# Patient Record
Sex: Female | Born: 1950 | Race: White | Hispanic: No | Marital: Married
Health system: Southern US, Community
[De-identification: ages and names within clinical notes are randomized; demographics above are authoritative.]

## PROBLEM LIST (undated history)

## (undated) DIAGNOSIS — G300 Alzheimer's disease with early onset: Secondary | ICD-10-CM

## (undated) DIAGNOSIS — F0280 Dementia in other diseases classified elsewhere without behavioral disturbance: Secondary | ICD-10-CM

## (undated) DIAGNOSIS — R627 Adult failure to thrive: Secondary | ICD-10-CM

---

## 2017-02-16 ENCOUNTER — Emergency Department (HOSPITAL_COMMUNITY): Payer: Medicare Other

## 2017-02-16 ENCOUNTER — Inpatient Hospital Stay (HOSPITAL_COMMUNITY)
Admission: EM | Admit: 2017-02-16 | Discharge: 2017-02-19 | DRG: 871 | Disposition: A | Discharge status: Hospice | Payer: Medicare Other | Attending: Family Medicine | Admitting: Family Medicine

## 2017-02-16 ENCOUNTER — Encounter (HOSPITAL_COMMUNITY): Payer: Self-pay | Admitting: Emergency Medicine

## 2017-02-16 DIAGNOSIS — F02818 Dementia in other diseases classified elsewhere, unspecified severity, with other behavioral disturbance: Secondary | ICD-10-CM | POA: Diagnosis present

## 2017-02-16 DIAGNOSIS — R627 Adult failure to thrive: Secondary | ICD-10-CM | POA: Diagnosis present

## 2017-02-16 DIAGNOSIS — Z66 Do not resuscitate: Secondary | ICD-10-CM | POA: Diagnosis present

## 2017-02-16 DIAGNOSIS — R0602 Shortness of breath: Secondary | ICD-10-CM

## 2017-02-16 DIAGNOSIS — R0603 Acute respiratory distress: Secondary | ICD-10-CM | POA: Diagnosis present

## 2017-02-16 DIAGNOSIS — J9601 Acute respiratory failure with hypoxia: Secondary | ICD-10-CM | POA: Diagnosis present

## 2017-02-16 DIAGNOSIS — G40909 Epilepsy, unspecified, not intractable, without status epilepticus: Secondary | ICD-10-CM

## 2017-02-16 DIAGNOSIS — F0281 Dementia in other diseases classified elsewhere with behavioral disturbance: Secondary | ICD-10-CM | POA: Diagnosis present

## 2017-02-16 DIAGNOSIS — A419 Sepsis, unspecified organism: Principal | ICD-10-CM

## 2017-02-16 DIAGNOSIS — J189 Pneumonia, unspecified organism: Secondary | ICD-10-CM | POA: Diagnosis present

## 2017-02-16 DIAGNOSIS — G300 Alzheimer's disease with early onset: Secondary | ICD-10-CM | POA: Diagnosis present

## 2017-02-16 DIAGNOSIS — J181 Lobar pneumonia, unspecified organism: Secondary | ICD-10-CM | POA: Diagnosis present

## 2017-02-16 DIAGNOSIS — Z9981 Dependence on supplemental oxygen: Secondary | ICD-10-CM

## 2017-02-16 DIAGNOSIS — Z515 Encounter for palliative care: Secondary | ICD-10-CM | POA: Diagnosis not present

## 2017-02-16 DIAGNOSIS — R634 Abnormal weight loss: Secondary | ICD-10-CM | POA: Diagnosis present

## 2017-02-16 HISTORY — DX: Adult failure to thrive: R62.7

## 2017-02-16 HISTORY — DX: Alzheimer's disease with early onset: G30.0

## 2017-02-16 HISTORY — DX: Dementia in other diseases classified elsewhere, unspecified severity, without behavioral disturbance, psychotic disturbance, mood disturbance, and anxiety: F02.80

## 2017-02-16 LAB — I-STAT TROPONIN, ED: Troponin i, poc: 0 ng/mL (ref 0.00–0.08)

## 2017-02-16 LAB — COMPREHENSIVE METABOLIC PANEL
ALK PHOS: 78 U/L (ref 38–126)
ALT: 14 U/L (ref 14–54)
AST: 34 U/L (ref 15–41)
Albumin: 2.9 g/dL — ABNORMAL LOW (ref 3.5–5.0)
Anion gap: 7 (ref 5–15)
BILIRUBIN TOTAL: 0.7 mg/dL (ref 0.3–1.2)
BUN: 26 mg/dL — AB (ref 6–20)
CHLORIDE: 111 mmol/L (ref 101–111)
CO2: 29 mmol/L (ref 22–32)
CREATININE: 0.79 mg/dL (ref 0.44–1.00)
Calcium: 9 mg/dL (ref 8.9–10.3)
GFR calc Af Amer: >60 mL/min (ref >60)
Glucose, Bld: 171 mg/dL — ABNORMAL HIGH (ref 65–99)
Potassium: 3.1 mmol/L — ABNORMAL LOW (ref 3.5–5.1)
Sodium: 147 mmol/L — ABNORMAL HIGH (ref 135–145)
Total Protein: 6.3 g/dL — ABNORMAL LOW (ref 6.5–8.1)

## 2017-02-16 LAB — CBC
HEMATOCRIT: 46 % (ref 36.0–46.0)
HEMOGLOBIN: 14.3 g/dL (ref 12.0–15.0)
MCH: 33.6 pg (ref 26.0–34.0)
MCHC: 31.1 g/dL (ref 30.0–36.0)
MCV: 108 fL — AB (ref 78.0–100.0)
Platelets: 227 10*3/uL (ref 150–400)
RBC: 4.26 MIL/uL (ref 3.87–5.11)
RDW: 13.1 % (ref 11.5–15.5)
WBC: 15.5 10*3/uL — AB (ref 4.0–10.5)

## 2017-02-16 LAB — I-STAT VENOUS BLOOD GAS, ED
ACID-BASE EXCESS: 4 mmol/L — AB (ref 0.0–2.0)
BICARBONATE: 32.3 mmol/L — AB (ref 20.0–28.0)
O2 Saturation: 66 %
PH VEN: 7.319 (ref 7.250–7.430)
PO2 VEN: 39 mmHg (ref 32.0–45.0)
TCO2: 34 mmol/L — ABNORMAL HIGH (ref 22–32)
pCO2, Ven: 62.9 mmHg — ABNORMAL HIGH (ref 44.0–60.0)

## 2017-02-16 LAB — I-STAT CG4 LACTIC ACID, ED
LACTIC ACID, VENOUS: 3.05 mmol/L — AB (ref 0.5–1.9)
Lactic Acid, Venous: 4.56 mmol/L (ref 0.5–1.9)

## 2017-02-16 MED ORDER — VALPROATE SODIUM 500 MG/5ML IV SOLN
125.0000 mg | Freq: Two times a day (BID) | INTRAVENOUS | Status: DC
  Administered 2017-02-17 – 2017-02-19 (×6): 125 mg via INTRAVENOUS
  Filled 2017-02-16 (×6): qty 1.25

## 2017-02-16 MED ORDER — MORPHINE SULFATE (PF) 4 MG/ML IV SOLN
3.0000 mg | Freq: Once | INTRAVENOUS | Status: AC
  Administered 2017-02-16: 3 mg via INTRAVENOUS

## 2017-02-16 MED ORDER — SODIUM CHLORIDE 0.9 % IV SOLN
2.0000 mg/h | INTRAVENOUS | Status: DC
  Administered 2017-02-16: 2 mg/h via INTRAVENOUS
  Filled 2017-02-16 (×2): qty 10

## 2017-02-16 MED ORDER — NALOXONE HCL 0.4 MG/ML IJ SOLN
INTRAMUSCULAR | Status: AC
  Administered 2017-02-16: 0.4 mg
  Filled 2017-02-16: qty 1

## 2017-02-16 MED ORDER — SODIUM CHLORIDE 0.9 % IV BOLUS (SEPSIS)
1000.0000 mL | Freq: Once | INTRAVENOUS | Status: AC
  Administered 2017-02-16: 1000 mL via INTRAVENOUS

## 2017-02-16 MED ORDER — PIPERACILLIN-TAZOBACTAM 3.375 G IVPB 30 MIN
3.3750 g | Freq: Once | INTRAVENOUS | Status: AC
  Administered 2017-02-16: 3.375 g via INTRAVENOUS
  Filled 2017-02-16: qty 50

## 2017-02-16 MED ORDER — SODIUM CHLORIDE 0.9 % IV SOLN
2.0000 mg/h | INTRAVENOUS | Status: DC
  Administered 2017-02-16 – 2017-02-18 (×2): 2 mg/h via INTRAVENOUS
  Filled 2017-02-16: qty 10

## 2017-02-16 MED ORDER — SODIUM CHLORIDE 0.9 % IV SOLN
750.0000 mg | Freq: Two times a day (BID) | INTRAVENOUS | Status: DC
  Administered 2017-02-16 – 2017-02-19 (×6): 750 mg via INTRAVENOUS
  Filled 2017-02-16 (×6): qty 7.5

## 2017-02-16 MED ORDER — VANCOMYCIN HCL IN DEXTROSE 1-5 GM/200ML-% IV SOLN
1000.0000 mg | Freq: Once | INTRAVENOUS | Status: AC
  Administered 2017-02-16: 1000 mg via INTRAVENOUS
  Filled 2017-02-16: qty 200

## 2017-02-16 MED ORDER — MORPHINE SULFATE (PF) 4 MG/ML IV SOLN
INTRAVENOUS | Status: AC
  Filled 2017-02-16: qty 1

## 2017-02-16 MED ORDER — VANCOMYCIN HCL IN DEXTROSE 750-5 MG/150ML-% IV SOLN: 750.0000 mg | INTRAVENOUS | Status: DC

## 2017-02-16 MED ORDER — MORPHINE BOLUS VIA INFUSION
2.0000 mg | INTRAVENOUS | Status: DC | PRN
  Administered 2017-02-18: 2 mg via INTRAVENOUS
  Filled 2017-02-16: qty 2

## 2017-02-16 NOTE — ED Triage Notes (Signed)
Patient from [facility], patient was found by staff with [facility], bilateral rhonchi for breath sounds, thick mucous production and oxygen saturations in the 50s.  She is responsive to pain at this time.

## 2017-02-16 NOTE — Progress Notes (Signed)
Spiritual care providing support with care team.    In conversation with MD, pt's daughter in law and son elected for DNR / DNI.    Son and Daughter in [place]: Julie Gully) Gwinda Passe. and Lindie Spruce [place]  [phone] [address]  [place], [place] [address]    Chaplain present at bedside with pt.   Will monitor and assist in updating pt's family.   Provide presence with pt. as family deems appropriate as well as spiritual assessment and support with this family via phone.    [facility] / [facility] Chaplain Burnis Kingfisher, MDiv  System Admin. Oncall.

## 2017-02-16 NOTE — Progress Notes (Signed)
Pharmacy Antibiotic Note  Julie Baldwin is a 66 y.o. female admitted on 02/16/2017 with pneumonia.  Pharmacy has been consulted for vancomycin dosing. WBC 15.5, Scr 0.79, LA 3.05. Estimated weight in ED ~40-45kg. Also received 1 time dose of zosyn per EDP. Chart from SNF reviewed, not noted to have received any antibiotics recently.   Plan: Vancomycin LD of 1000 mg IV X1 scheduled. Then, maintenance Vancomycin 750 mg IV every 24 hours.  Goal trough 15-20 mcg/mL. Loading dose of zosyn 3.375 g IV X1 received- follow up if need to continue zosyn? Monitor for Scr, c/s, clinical resolution. F/u de-escalation plan/LOT, vancomycin trough as indicated      No data recorded.   Recent Labs Lab 02/16/17 1938 02/16/17 1944  WBC 15.5*  --   CREATININE 0.79  --   LATICACIDVEN  --  3.05*    CrCl cannot be calculated (Unknown ideal weight.).    No Known Allergies  Antimicrobials this admission: 10/2 Zosyn >> 3.375mg  X1   Thank you for allowing pharmacy to be a part of this patient's care.  Emeline General, PharmD Candidate 02/16/2017 9:12 PM

## 2017-02-16 NOTE — ED Provider Notes (Signed)
[facility]-EMERGENCY DEPT Provider Note   CSN: [id number] Arrival date & time:      History   Chief Complaint Chief Complaint  Patient presents with  . Respiratory Distress   HPI  Julie Baldwin is a 66yo female with a past medical history significant for advanced Alzheimer's dementia who presents to the ED via EMS from a nursing home with shortness of breath.  Per EMS, she was hypoxic at the time of their arrival with copious mucous production and oxygen saturations in the 50s.  She arrived being bagged by EMS with oxygen saturation in the 80s.  At this time, she is unresponsive but with spontaneous, shallow respirations.  The patient is DNR, per nursing home chart review.   The history is provided by the EMS personnel. No language interpreter was used.   Past Medical History:  Diagnosis Date  . Adult failure to thrive   . Early onset Alzheimer's dementia     Patient Active Problem List   Diagnosis Date Noted  . Right lower lobe pneumonia (HCC) 02/16/2017  . Admission for end of life care 02/16/2017  . Adult failure to thrive 02/16/2017  . Early onset Alzheimer's disease with behavioral disturbance 02/16/2017  . Seizure disorder (HCC) 02/16/2017   History reviewed. No pertinent surgical history.  OB History    No data available      Home Medications    Prior to Admission medications   Medication Sig Start Date End Date Taking? Authorizing Provider  Dextromethorphan-Quinidine (NUEDEXTA) 20-10 MG CAPS Take 1 tablet by mouth daily.   Yes [provider]  divalproex (DEPAKOTE SPRINKLE) 125 MG capsule Take 125 mg by mouth 2 (two) times daily.   Yes [provider]  lactose free nutrition (BOOST PLUS) LIQD Take 237 mLs by mouth 4 (four) times daily.   Yes [provider]  levETIRAcetam (KEPPRA) 100 MG/ML solution Take 750 mg by mouth 2 (two) times daily.   Yes [provider]  levothyroxine (SYNTHROID, LEVOTHROID) 25 MCG tablet Take 25 mcg by  mouth daily before breakfast.   Yes [provider]  Nutritional Supplements (RESOURCE 2.0) LIQD Take 90 mLs by mouth 3 (three) times daily.   Yes [provider]  oxyCODONE-acetaminophen (PERCOCET) 7.5-325 MG tablet Take 1 tablet by mouth every 8 (eight) hours.   Yes [provider]  OXYGEN Inhale 2 L into the lungs continuous.   Yes [provider]  polyethylene glycol (MIRALAX / GLYCOLAX) packet Take 17 g by mouth daily.   Yes [provider]   Family History History reviewed. No pertinent family history.  Social History Social History  Substance Use Topics  . Smoking status: Not on file  . Smokeless tobacco: Not on file  . Alcohol use Not on file   Allergies   Patient has no known allergies.  Review of Systems Review of Systems  Unable to perform ROS: Mental status change  Respiratory: Positive for shortness of breath.    Physical Exam Updated Vital Signs BP (!) 162/72   Pulse 84   Resp (!) 23   SpO2 (!) 88%   Physical Exam  Constitutional: She appears distressed.  Cachectic, pale, ill-appearing woman  HENT:  Head: Normocephalic and atraumatic.  Eyes: Pupils are equal, round, and reactive to light.  Constricted, sluggish pupils  Cardiovascular: Regular rhythm.   No murmur heard. Weak distal pulses; tachycardic  Pulmonary/Chest: She has wheezes.  Tachypnea, shallow respirations, hypoxic on BVM; coarse breath sounds throughout all lung fields  Abdominal: She exhibits no distension. There is no guarding.  Musculoskeletal:  contractures  Neurological:  Unresponsive to painful stimuli  Skin: Capillary refill takes more than 3 seconds. There is pallor.  Nursing note and vitals reviewed.  ED Treatments / Results  Labs (all labs ordered are listed, but only abnormal results are displayed) Labs Reviewed  CBC - Abnormal; Notable for the following:       Result Value   WBC 15.5 (*)    MCV 108.0 (*)    All other  components within normal limits  COMPREHENSIVE METABOLIC PANEL - Abnormal; Notable for the following:    Sodium 147 (*)    Potassium 3.1 (*)    Glucose, Bld 171 (*)    BUN 26 (*)    Total Protein 6.3 (*)    Albumin 2.9 (*)    All other components within normal limits  I-STAT CG4 LACTIC ACID, ED - Abnormal; Notable for the following:    Lactic Acid, Venous 3.05 (*)    All other components within normal limits  I-STAT VENOUS BLOOD GAS, ED - Abnormal; Notable for the following:    pCO2, Ven 62.9 (*)    Bicarbonate 32.3 (*)    TCO2 34 (*)    Acid-Base Excess 4.0 (*)    All other components within normal limits  I-STAT CG4 LACTIC ACID, ED - Abnormal; Notable for the following:    Lactic Acid, Venous 4.56 (*)    All other components within normal limits  I-STAT TROPONIN, ED    EKG  EKG Interpretation None      Radiology Dg Chest Portable 1 View  Result Date: 02/16/2017 CLINICAL DATA:  Altered level of consciousness EXAM: PORTABLE CHEST 1 VIEW COMPARISON:  None. FINDINGS: Suspected right pleural effusion, small moderate in size. Consolidation in the right middle lobe and right lower lobe with slight shift of mediastinal contents to the right suggesting some volume loss. Left lung is grossly clear. Normal heart size. Aortic atherosclerosis. Probable emphysematous disease in the apices. No pneumothorax. IMPRESSION: Suspected small moderate right pleural effusion with consolidative process in the right middle lobe and right lower lobe. Rug thick follow-up to resolution is recommended; alternatively CT could be considered for further evaluation. Electronically Signed   By: Jasmine Pang M.D.   On: 02/16/2017 20:06    Procedures Procedures (including critical care time)  Medications Ordered in ED Medications  vancomycin (VANCOCIN) IVPB 750 mg/150 ml premix (not administered)  morphine 250 mg in sodium chloride 0.9 % 250 mL (1 mg/mL) infusion (2 mg/hr Intravenous Transfusing/Transfer  02/16/17 2344)  morphine bolus via infusion 2 mg (not administered)  levETIRAcetam (KEPPRA) 750 mg in sodium chloride 0.9 % 100 mL IVPB (750 mg Intravenous Transfusing/Transfer 02/16/17 2344)  valproate (DEPACON) 62.5 mg in dextrose 5 % 50 mL IVPB (not administered)  naloxone (NARCAN) 0.4 MG/ML injection (0.4 mg  Given 02/16/17 1941)  morphine 4 MG/ML injection 3 mg (3 mg Intravenous Given 02/16/17 1946)  sodium chloride 0.9 % bolus 1,000 mL (0 mLs Intravenous Stopped 02/16/17 2153)  piperacillin-tazobactam (ZOSYN) IVPB 3.375 g (0 g Intravenous Stopped 02/16/17 2151)  sodium chloride 0.9 % bolus 1,000 mL (0 mLs Intravenous Stopped 02/16/17 2152)  vancomycin (VANCOCIN) IVPB 1000 mg/200 mL premix (0 mg Intravenous Stopped 02/16/17 2252)   Initial Impression / Assessment and Plan / ED Course  I have reviewed the triage vital signs and the nursing notes.  Pertinent labs & imaging results that were available during my care of  the patient were reviewed by me and considered in my medical decision making (see chart for details).   On initial exam, I was most concerned for sepsis, hypoxia, drug intoxication, and glucose abnormality.  Shortly after arrival, the attending spoke with the patient's family via phone and the patient's DNR/DNI status was confirmed.  Family reported that the patient had recently come off of Hospice care due to overall improvement in her health, however they understood the gravity of her overall condition.  Pertinent labs included CBC with leukocytosis.  CMP with hypokalemia; no AKI or transaminitis.  Initial lactic acid 3.05.  ABG with pH 7.319, and PCO2 60.9, concerning for respiratory acidosis.  Initial troponin negative.  EKG with poor baseline, however diffuse ST depression noted.Marland Kitchen  CXR with consolidation of the right lung concerning for pneumonia.  She received IV fluids, and broad-spectrum antibiotics were initiated.  She was given a bolus of 3 mg morphine, and placed on a morphine  drip for comfort.    The patient's son came to the hospital.  After discussion with the patient's son by myself and the attending physician, the decision was made to transition the patient to comfort care measures only.  The patient was admitted to the Hospitalist service for continued end of life care.  Final Clinical Impressions(s) / ED Diagnoses   Final diagnoses:  Pneumonia of right lower lobe due to infectious organism [facility])  Acute respiratory failure with hypoxia (HCC)  Shortness of breath   New Prescriptions Current Discharge Medication List       Levester Fresh, MD 02/17/17 0049    Abelino Derrick, MD 02/20/17 1527

## 2017-02-16 NOTE — H&P (Signed)
History and Physical    Julie Baldwin NWG:[id number] DOB: Oct 04, 1950 DOA: 02/16/2017  PCP: Patient, No Pcp Per  Patient coming from: SNF  I have personally briefly reviewed patient's old medical records in [facility]  Chief Complaint: Respiratory distress  HPI: Julie Baldwin is a 66 y.o. female with medical history significant of Early onset alzheimers dementia, which has been progressing since she was admitted to SNF back in 2011.  At baseline patient has basically had adult failure to thrive recently, weight loss.  Minimally to not at all responsive.  Typically sits in a decorticate posture.  Patient is already technically on hospice, is DNI/DNR, and they have decided against a feeding tube.  Today she is brought in to the ED with acute respiratory distress, O2 sats in the 50s, rhonchorous breath sounds.   ED Course: RLL PNA with peripneumonic effusion.  Patient satting in low to mid 90s now, but this on a NRB.   Review of Systems: Unable to perform.   Past Medical History:  Diagnosis Date  . Adult failure to thrive   . Early onset Alzheimer's dementia     History reviewed. No pertinent surgical history.   has no tobacco, alcohol, and drug history on file.  No Known Allergies  History reviewed. No pertinent family history. Son is alive and well, at bedside.  Prior to Admission medications   Medication Sig Start Date End Date Taking? Authorizing Provider  Dextromethorphan-Quinidine (NUEDEXTA) 20-10 MG CAPS Take 1 tablet by mouth daily.   Yes [provider]  divalproex (DEPAKOTE SPRINKLE) 125 MG capsule Take 125 mg by mouth 2 (two) times daily.   Yes [provider]  lactose free nutrition (BOOST PLUS) LIQD Take 237 mLs by mouth 4 (four) times daily.   Yes [provider]  levETIRAcetam (KEPPRA) 100 MG/ML solution Take 750 mg by mouth 2 (two) times daily.   Yes [provider]  levothyroxine (SYNTHROID, LEVOTHROID) 25 MCG  tablet Take 25 mcg by mouth daily before breakfast.   Yes [provider]  Nutritional Supplements (RESOURCE 2.0) LIQD Take 90 mLs by mouth 3 (three) times daily.   Yes [provider]  oxyCODONE-acetaminophen (PERCOCET) 7.5-325 MG tablet Take 1 tablet by mouth every 8 (eight) hours.   Yes [provider]  OXYGEN Inhale 2 L into the lungs continuous.   Yes [provider]  polyethylene glycol (MIRALAX / GLYCOLAX) packet Take 17 g by mouth daily.   Yes [provider]    Physical Exam: Vitals:   02/16/17 2100 02/16/17 2130 02/16/17 2200 02/16/17 2230  BP: 125/88 127/80 (!) 141/75 (!) 174/85  Pulse: 97  83 89  Resp: (!) 31 (!) 24 (!) 22 (!) 25  SpO2: 91%  95% 95%    Constitutional: Cachectic, chronically ill appearing Eyes: PERRL, lids and conjunctivae normal ENMT: Mucous membranes are moist. Posterior pharynx clear of any exudate or lesions.Normal dentition.  Neck: normal, supple, no masses, no thyromegaly Respiratory: clear to auscultation bilaterally, no wheezing, no crackles. Normal respiratory effort. No accessory muscle use.  Cardiovascular: Regular rate and rhythm, no murmurs / rubs / gallops. No extremity edema. 2+ pedal pulses. No carotid bruits.  Abdomen: no tenderness, no masses palpated. No hepatosplenomegaly. Bowel sounds positive.  Musculoskeletal: no clubbing / cyanosis. No joint deformity upper and lower extremities. Good ROM, no contractures. Normal muscle tone.  Skin: no rashes, lesions, ulcers. No induration Neurologic: Not responsive, decorticate positioning of arms. Psychiatric: Not responsive   Labs  on Admission: I have personally reviewed following labs and imaging studies  CBC:  Recent Labs Lab 02/16/17 1938  WBC 15.5*  HGB 14.3  HCT 46.0  MCV 108.0*  PLT 227   Basic Metabolic Panel:  Recent Labs Lab 02/16/17 1938  NA 147*  K 3.1*  CL 111  CO2 29  GLUCOSE 171*  BUN 26*  CREATININE 0.79  CALCIUM  9.0   GFR: CrCl cannot be calculated (Unknown ideal weight.). Liver Function Tests:  Recent Labs Lab 02/16/17 1938  AST 34  ALT 14  ALKPHOS 78  BILITOT 0.7  PROT 6.3*  ALBUMIN 2.9*   No results for input(s): LIPASE, AMYLASE in the last 168 hours. No results for input(s): AMMONIA in the last 168 hours. Coagulation Profile: No results for input(s): INR, PROTIME in the last 168 hours. Cardiac Enzymes: No results for input(s): CKTOTAL, CKMB, CKMBINDEX, TROPONINI in the last 168 hours. BNP (last 3 results) No results for input(s): PROBNP in the last 8760 hours. HbA1C: No results for input(s): HGBA1C in the last 72 hours. CBG: No results for input(s): GLUCAP in the last 168 hours. Lipid Profile: No results for input(s): CHOL, HDL, LDLCALC, TRIG, CHOLHDL, LDLDIRECT in the last 72 hours. Thyroid Function Tests: No results for input(s): TSH, T4TOTAL, FREET4, T3FREE, THYROIDAB in the last 72 hours. Anemia Panel: No results for input(s): VITAMINB12, FOLATE, FERRITIN, TIBC, IRON, RETICCTPCT in the last 72 hours. Urine analysis: No results found for: COLORURINE, APPEARANCEUR, LABSPEC, PHURINE, GLUCOSEU, HGBUR, BILIRUBINUR, KETONESUR, PROTEINUR, UROBILINOGEN, NITRITE, LEUKOCYTESUR  Radiological Exams on Admission: Dg Chest Portable 1 View  Result Date: 02/16/2017 CLINICAL DATA:  Altered level of consciousness EXAM: PORTABLE CHEST 1 VIEW COMPARISON:  None. FINDINGS: Suspected right pleural effusion, small moderate in size. Consolidation in the right middle lobe and right lower lobe with slight shift of mediastinal contents to the right suggesting some volume loss. Left lung is grossly clear. Normal heart size. Aortic atherosclerosis. Probable emphysematous disease in the apices. No pneumothorax. IMPRESSION: Suspected small moderate right pleural effusion with consolidative process in the right middle lobe and right lower lobe. Rug thick follow-up to resolution is recommended; alternatively  CT could be considered for further evaluation. Electronically Signed   By: Jasmine Pang M.D.   On: 02/16/2017 20:06    EKG: Independently reviewed.  Assessment/Plan Principal Problem:   Admission for end of life care Active Problems:   Right lower lobe pneumonia [facility])   Adult failure to thrive   Early onset Alzheimer's disease with behavioral disturbance   Seizure disorder (HCC)    1. Admission for end of life care - 1. Given complete absence of quality of life at baseline, terminal dementia with adult failure to thrive, etc (see HPI for description): 2. Son and wife have come to decision to pursue full comfort measures a this point 3. They do not want any further antibiotics at this point and request that patient just be kept comfortable. 4. No needle sticks 5. Morphine gtt for comfort and air hunger 6. Will leave on O2 via NRB for the moment, but nothing further we can escalate to if she worsens on this. 7. Call pal care in AM 2. H/o seizures - Will convert patients home keppra and depacon to IV to prevent seizures since I see she is on these at home  DVT prophylaxis: None Code Status: DNI/DNR - comfort measures only Family Communication: Son at bedside and wife on phone Disposition Plan: SNF vs Hospice Consults called: None, call  pal care in AM Admission status: Admit to inpatient   Hillary Bow DO [facility] Pager [phone]  If 7AM-7PM, please contact day team taking care of patient [email or web address] Password [email or web address]  02/16/2017, 10:55 PM

## 2017-02-17 DIAGNOSIS — J181 Lobar pneumonia, unspecified organism: Secondary | ICD-10-CM

## 2017-02-17 DIAGNOSIS — A419 Sepsis, unspecified organism: Principal | ICD-10-CM

## 2017-02-17 LAB — MRSA PCR SCREENING: MRSA by PCR: POSITIVE — AB

## 2017-02-17 MED ORDER — MUPIROCIN 2 % EX OINT
TOPICAL_OINTMENT | Freq: Two times a day (BID) | CUTANEOUS | Status: DC
  Administered 2017-02-17 – 2017-02-19 (×4): via NASAL
  Filled 2017-02-17: qty 22

## 2017-02-17 NOTE — Progress Notes (Signed)
Pt from ED to 6N02. Resposive to pain. VS BP=162/72, P=84, RR=23 Satting 88% on NRB mask 15L. Son at bedside. Oriented to the room. Will continue to monitor pt.

## 2017-02-17 NOTE — Progress Notes (Signed)
Nutrition Brief Note  Chart reviewed. Pt now transitioning to comfort care.  No further nutrition interventions warranted at this time.  Please re-consult as needed.   Rudie Sermons A. Zakhi Dupre, RD, LDN, CDE Pager: [phone] After hours Pager: [phone]  

## 2017-02-17 NOTE — Progress Notes (Signed)
  PROGRESS NOTE  Julie Baldwin UJW:[id number] DOB: 12-Jan-1951 DOA: 02/16/2017 PCP: Patient, No Pcp Per  Brief Narrative: 66 year old woman PMH early onset Alzheimer's dementia, adult failure to thrive, weight loss, minimally responsive. Previously on hospice. To the emergency department with acute respiratory distress, found to have pneumonia, sepsis, hypoxia. After further discussion with family, she was admitted for end-of-life care and full comfort measures were requested she was started on morphine infusion and admitted for further care..  Assessment/Plan Sepsis secondary to lobar pneumonia right middle, right lower lobes with associated acute hypoxic respiratory failure -full comfort care per wishes. Continue morphine infusion.  Advanced Alzheimer's dementia -supportive care  Seizure disorder. -Keppra, Depacon  DVT prophylaxis: not indicated Code Status: DNR/DNI Family Communication: none Disposition Plan: anticipate hospital death    Brendia Sacks, MD  [facility] Direct contact: [phone] --Via amion app OR  --[email or web address]; password [email or web address]  7PM-7AM contact night coverage as above 02/17/2017, 1:20 PM  LOS: 1 day   Consultants:    Procedures:    Antimicrobials:    Interval history/Subjective: nonverbal  Objective: Vitals: 23, 84, 162/72, 80% on nonrebreather  Exam:     Constitutional: appears calm and comfortable. Appears to be dying.  Respiratory. Deep respirations. Lungs clear. No wheezes, rales or rhonchi.  Cardiovascular. Regular rate and rhythm. No murmur, rub or gallop. No lower extremity edema.   I have personally reviewed the following:   Labs:  Lactic acidosis noted.  Imaging studies:  Chest x-ray with dense right lower lobe consolidation.  Scheduled Meds: . mupirocin ointment   Nasal BID   Continuous Infusions: . levETIRAcetam Stopped (02/17/17 1237)  . morphine 2 mg/hr (02/16/17 2343)  . valproate sodium 125 mg  (02/17/17 1311)    Principal Problem:   Sepsis (HCC) Active Problems:   Right lower lobe pneumonia (HCC)   Admission for end of life care   Adult failure to thrive   Early onset Alzheimer's disease with behavioral disturbance   Seizure disorder (HCC)   Lobar pneumonia (HCC)   LOS: 1 day

## 2017-02-17 NOTE — Plan of Care (Signed)
Problem: Education: Goal: Knowledge of [facility] Education information/materials will improve Outcome: Progressing With son

## 2017-02-18 MED ORDER — SCOPOLAMINE 1 MG/3DAYS TD PT72
1.0000 | MEDICATED_PATCH | TRANSDERMAL | Status: DC
  Administered 2017-02-18: 1.5 mg via TRANSDERMAL
  Filled 2017-02-18: qty 1
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
## 2017-02-18 NOTE — Progress Notes (Signed)
CSW spoke with family and confirmed agreement for hospice transfer- they are from [place] but agreeable to [place] placement to avoid transport cost and discomfort of transport for patient.  Family first choice is [facility], [facility] and exercise- [facility] liaison reviewing and will inform CSW of availability/appropriateness of program for patient  CSW will continue to follow  Burna Sis, LCSW Clinical Social Worker [phone]

## 2017-02-18 NOTE — Clinical Social Work Note (Signed)
Clinical Social Work Assessment  Patient Details  Name: Julie Baldwin MRN: [id number] Date of Birth: 08-Sep-1950  Date of referral:  02/18/17               Reason for consult:  Facility Placement, End of Life/Hospice                Permission sought to share information with:  Facility Art therapist granted to share information::  No (pt disoriented/actively dying)  Name::     Julie Baldwin::  hospice  Relationship::  son  Contact Information:     Housing/Transportation Living arrangements for the past 2 months:  [place] of Information:  Adult Children Patient Interpreter Needed:  None Criminal Activity/Legal Involvement Pertinent to Current Situation/Hospitalization:  No - Comment as needed Significant Relationships:  Adult Children Lives with:  Facility Resident Do you feel safe going back to the place where you live?  No Need for family participation in patient care:  Yes (Comment) (decision making)  Care giving concerns: Pt from SNF- currently actively dying and would not get sufficient care at SNF given current level of needs for comfort.   Social Worker assessment / plan:  CSW received consult from MD that we may need to pursue hospice for patient.  CSW met with pt son and (ex?) husband.  Patient son confirmed they are on board with comfort measures for patient at this time.  CSW explained residential hospice and goal of residential hospice to provide patient with as much comfort and dignity as possible at end of life- explained benefits of transferring to facility where staff is focused on comfort and trained in end of life measures.  Patient originally from [place] area but had to be evacuated due to [facility].  Patient has been at SNF here in [place] in the meantime.  Patient son is also from [place] and would prefer to have patient get closer to home if possible.  CSW explained difficulties of transferring  patient to [place] due to transport cost- son confirmed this would be a barrier to her return home due to cost.  Employment status:  Disabled (Comment on whether or not currently receiving Disability) Insurance information:  Medicare PT Recommendations:  Not assessed at this time Information / Referral to community resources:     Patient/Family's Response to care:  Family is agreeable to hospice placement if appropriate.    Family agreeable to placement in [place] due to transport barrier.  Patient/Family's Understanding of and Emotional Response to Diagnosis, Current Treatment, and Prognosis:  Patients family has good understanding of pt condition at this time and are just hopeful for her comfort at end of life.  Emotional Assessment Appearance:  Appears older than stated age Attitude/Demeanor/Rapport:  Unable to Assess Affect (typically observed):  Unable to Assess Orientation:    Alcohol / Substance use:  Not Applicable Psych involvement (Current and /or in the community):  No (Comment)  Discharge Needs  Concerns to be addressed:  Care Coordination Readmission within the last 30 days:    Current discharge risk:  Terminally ill Barriers to Discharge:  Hospice Bed not available   Jorge Ny, LCSW 02/18/2017, 10:02 AM

## 2017-02-18 NOTE — Progress Notes (Signed)
  PROGRESS NOTE  Julie Baldwin MPN:[id number] DOB: 03/17/1951 DOA: 02/16/2017 PCP: Patient, No Pcp Per  Brief Narrative: 66 year old woman PMH early onset Alzheimer's dementia, adult failure to thrive, weight loss, minimally responsive. Previously on hospice. To the emergency department with acute respiratory distress, found to have pneumonia, sepsis, hypoxia. After further discussion with family, she was admitted for end-of-life care and full comfort measures were requested she was started on morphine infusion and admitted for further care..  Assessment/Plan Sepsis secondary to lobar pneumonia right middle, right lower lobes with associated acute hypoxic respiratory failure -met with son and daughter-in-law yesterday at bedside. Plan continues to be full comfort care. We discussed transition to residential hospice if patient's decline slowed.  Advanced Alzheimer's dementia -continue supportive care  Seizure disorder. -continue Keppra, Depacon  DVT prophylaxis: not indicated Code Status: DNR/DNI Family Communication:  Disposition Plan: discussed above with son and daughter-in-law yesterday. Given the patient's relative stability, think residential hospice would be most appropriate setting for patient's end-of-life care. Will consult social work.   Murray Hodgkins, MD  [facility] Direct contact: [phone] --Via amion app OR  --[email or web address]; password [email or web address]  7PM-7AM contact night coverage as above 02/18/2017, 8:50 AM  LOS: 2 days   Consultants:    Procedures:    Antimicrobials:    Interval history/Subjective: No issues overnight per RN  Objective: Vitals: febrile, 98.1, 10, 78, 118/62, 99% on nonrebreather  Exam:     Constitutional. Appears calm, comfortable. Appears to be sleeping.  Cardiovascular. Regular rate and rhythm. No murmur, rub or gallop. No significant lower extremity edema.  Respiratory. Clear to auscultation bilaterally. Slow  respirations. No wheezes, rales or rhonchi.   Psychiatric. Not assessable.   I have personally reviewed the following:    Scheduled Meds: . mupirocin ointment   Nasal BID   Continuous Infusions: . levETIRAcetam Stopped (02/17/17 2350)  . morphine 2 mg/hr (02/18/17 0829)  . valproate sodium Stopped (02/17/17 2350)    Principal Problem:   Sepsis ([place]) Active Problems:   Right lower lobe pneumonia ([place])   Admission for end of life care   Adult failure to thrive   Early onset Alzheimer's disease with behavioral disturbance   Seizure disorder ([place])   Lobar pneumonia ([place])   LOS: 2 days

## 2017-02-19 NOTE — Progress Notes (Signed)
[facility] Liaison Registration Visit at 11:30  [facility] bed has become available. Reconfirmed interest with CSW [place] and family with request for transfer today.  Met with son Elenore Rota and family to explain services. Family agreeable to transfer today. CSW updated.  Registration paper work completed. Dr. Orpah Melter to assume care per family request.  Please fax discharge summary to [phone].  RN please call report to [phone].  And Please arrange transport for patient to arrive as soon as possible.   Thank you,  Gar Ponto, [facility] Liaison [place] are now found on AMION.

## 2017-02-19 NOTE — Progress Notes (Signed)
Wasted about of morphine drip, witnessed by Evonnie Dawes, RN

## 2017-02-19 NOTE — Progress Notes (Signed)
Discharge to [facility] place, transported by [employer].

## 2017-02-19 NOTE — Progress Notes (Signed)
Patient will discharge to [facility] Anticipated discharge date: 10/5 Family notified: at bedside Transportation by [facility]- called at 11:55am  CSW signing off.  Burna Sis, LCSW Clinical Social Worker [phone]

## 2017-02-19 NOTE — Discharge Summary (Addendum)
Physician Discharge Summary  Julie Baldwin WGN:[id number] DOB: 04-Sep-1950 DOA: 02/16/2017  PCP: Patient, No Pcp Per  Admit date: 02/16/2017 Discharge date: 02/19/2017  Recommendations for Outpatient Follow-up:  1. Transfer to residential hospice for end-of-life care. 2. Unable to PO, no route to continue divalproex or levetiracetam.   Discharge Diagnoses:  1. Sepsis secondary to lobar pneumonia, right middle and right lower lobes 2. Acute hypoxic respiratory failure 3. Advanced Alzheimer's dementia 4. Seizure disorder  Discharge Condition: terminal, currently stable Disposition: residential hospice  Diet recommendation: NPO   History of present illness:  66 year old woman PMH early onset Alzheimer's dementia, adult failure to thrive, weight loss, minimally responsive. Previously on hospice. To the emergency department with acute respiratory distress, found to have pneumonia, sepsis, hypoxia. After further discussion with family, she was admitted for end-of-life care and full comfort measures were requested she was started on morphine infusion and admitted for further care.Marland Kitchen  Hospital Course:  Patient was treated with comfort measures, morphine infusion and supportive care. Her vital signs remained stable, although her prognosis is terminal and likely the next 24-48 hours, her condition is unchanged over the last 24 hours. In discussion with family, it was felt that it was in patient's best interest to transfer to hospice facility. Hospitalization was uncomplicated. Individual issues as below.  Sepsis secondary to lobar pneumonia, right middle and right lower lobes, with associated acute hypoxic respiratory failure. Family wished for full comfort care, no treatment.  Advanced Alzheimer's dementia  Seizure disorder. No seizures during hospitalization.  Today's assessment: S: nonverbal. Does not awaken. O: Vitals: 99.1, 14, 71, 141/71, 98% on nonrebreather   Constitutional.  Appears calm, comfortable.  Respiratory. Hypopneic. Clear breath sounds. No wheezes, rales or rhonchi.  Cardiovascular. Regular rate and rhythm. No murmur, rub or gallop. No significant lower extremity edema.  Discharge Instructions   Allergies as of 02/19/2017   No Known Allergies     Medication List    STOP taking these medications   divalproex 125 MG capsule Commonly known as:  DEPAKOTE SPRINKLE   lactose free nutrition Liqd   levETIRAcetam 100 MG/ML solution Commonly known as:  KEPPRA   levothyroxine 25 MCG tablet Commonly known as:  SYNTHROID, LEVOTHROID   NUEDEXTA 20-10 MG Caps Generic drug:  Dextromethorphan-Quinidine   oxyCODONE-acetaminophen 7.5-325 MG tablet Commonly known as:  PERCOCET   polyethylene glycol packet Commonly known as:  MIRALAX / GLYCOLAX   RESOURCE 2.0 Liqd     TAKE these medications   OXYGEN Inhale 2 L into the lungs continuous.      No Known Allergies  The results of significant diagnostics from this hospitalization (including imaging, microbiology, ancillary and laboratory) are listed below for reference.    Significant Diagnostic Studies: Dg Chest Portable 1 View  Result Date: 02/16/2017 CLINICAL DATA:  Altered level of consciousness EXAM: PORTABLE CHEST 1 VIEW COMPARISON:  None. FINDINGS: Suspected right pleural effusion, small moderate in size. Consolidation in the right middle lobe and right lower lobe with slight shift of mediastinal contents to the right suggesting some volume loss. Left lung is grossly clear. Normal heart size. Aortic atherosclerosis. Probable emphysematous disease in the apices. No pneumothorax. IMPRESSION: Suspected small moderate right pleural effusion with consolidative process in the right middle lobe and right lower lobe. Rug thick follow-up to resolution is recommended; alternatively CT could be considered for further evaluation. Electronically Signed   By: Jasmine Pang M.D.   On: 02/16/2017 20:06     Microbiology: Recent Results (from the  past 240 hour(s))  MRSA PCR Screening     Status: Abnormal   Collection Time: 02/17/17  2:44 AM  Result Value Ref Range Status   MRSA by PCR POSITIVE (A) NEGATIVE Final    Comment:        The GeneXpert MRSA Assay (FDA approved for NASAL specimens only), is one component of a comprehensive MRSA colonization surveillance program. It is not intended to diagnose MRSA infection nor to guide or monitor treatment for MRSA infections. RESULT CALLED TO, READ BACK BY AND VERIFIED WITH: B.HALL RN AT 0730 02/17/17 BY A.DAVIS      Labs: Basic Metabolic Panel:  Recent Labs Lab 02/16/17 1938  NA 147*  K 3.1*  CL 111  CO2 29  GLUCOSE 171*  BUN 26*  CREATININE 0.79  CALCIUM 9.0   Liver Function Tests:  Recent Labs Lab 02/16/17 1938  AST 34  ALT 14  ALKPHOS 78  BILITOT 0.7  PROT 6.3*  ALBUMIN 2.9*    CBC:  Recent Labs Lab 02/16/17 1938  WBC 15.5*  HGB 14.3  HCT 46.0  MCV 108.0*  PLT 227     Principal Problem:   Sepsis ([facility]) Active Problems:   Right lower lobe pneumonia ([facility])   Admission for end of life care   Adult failure to thrive   Early onset Alzheimer's disease with behavioral disturbance   Seizure disorder ([facility])   Lobar pneumonia ([facility])   Time coordinating discharge: 25 minutes  Signed:  Brendia Sacks, MD [facility] 02/19/2017, 11:05 AM

## 2017-02-19 NOTE — Progress Notes (Signed)
Report given to [facility]on [facility]).

## 2019-01-16 IMAGING — DX DG CHEST 1V PORT
1 series · 1 of 1 positions shown · non-contrast
Comparison: None.

CLINICAL DATA: Altered level of consciousness

EXAM:
PORTABLE CHEST 1 VIEW

[chest ap]
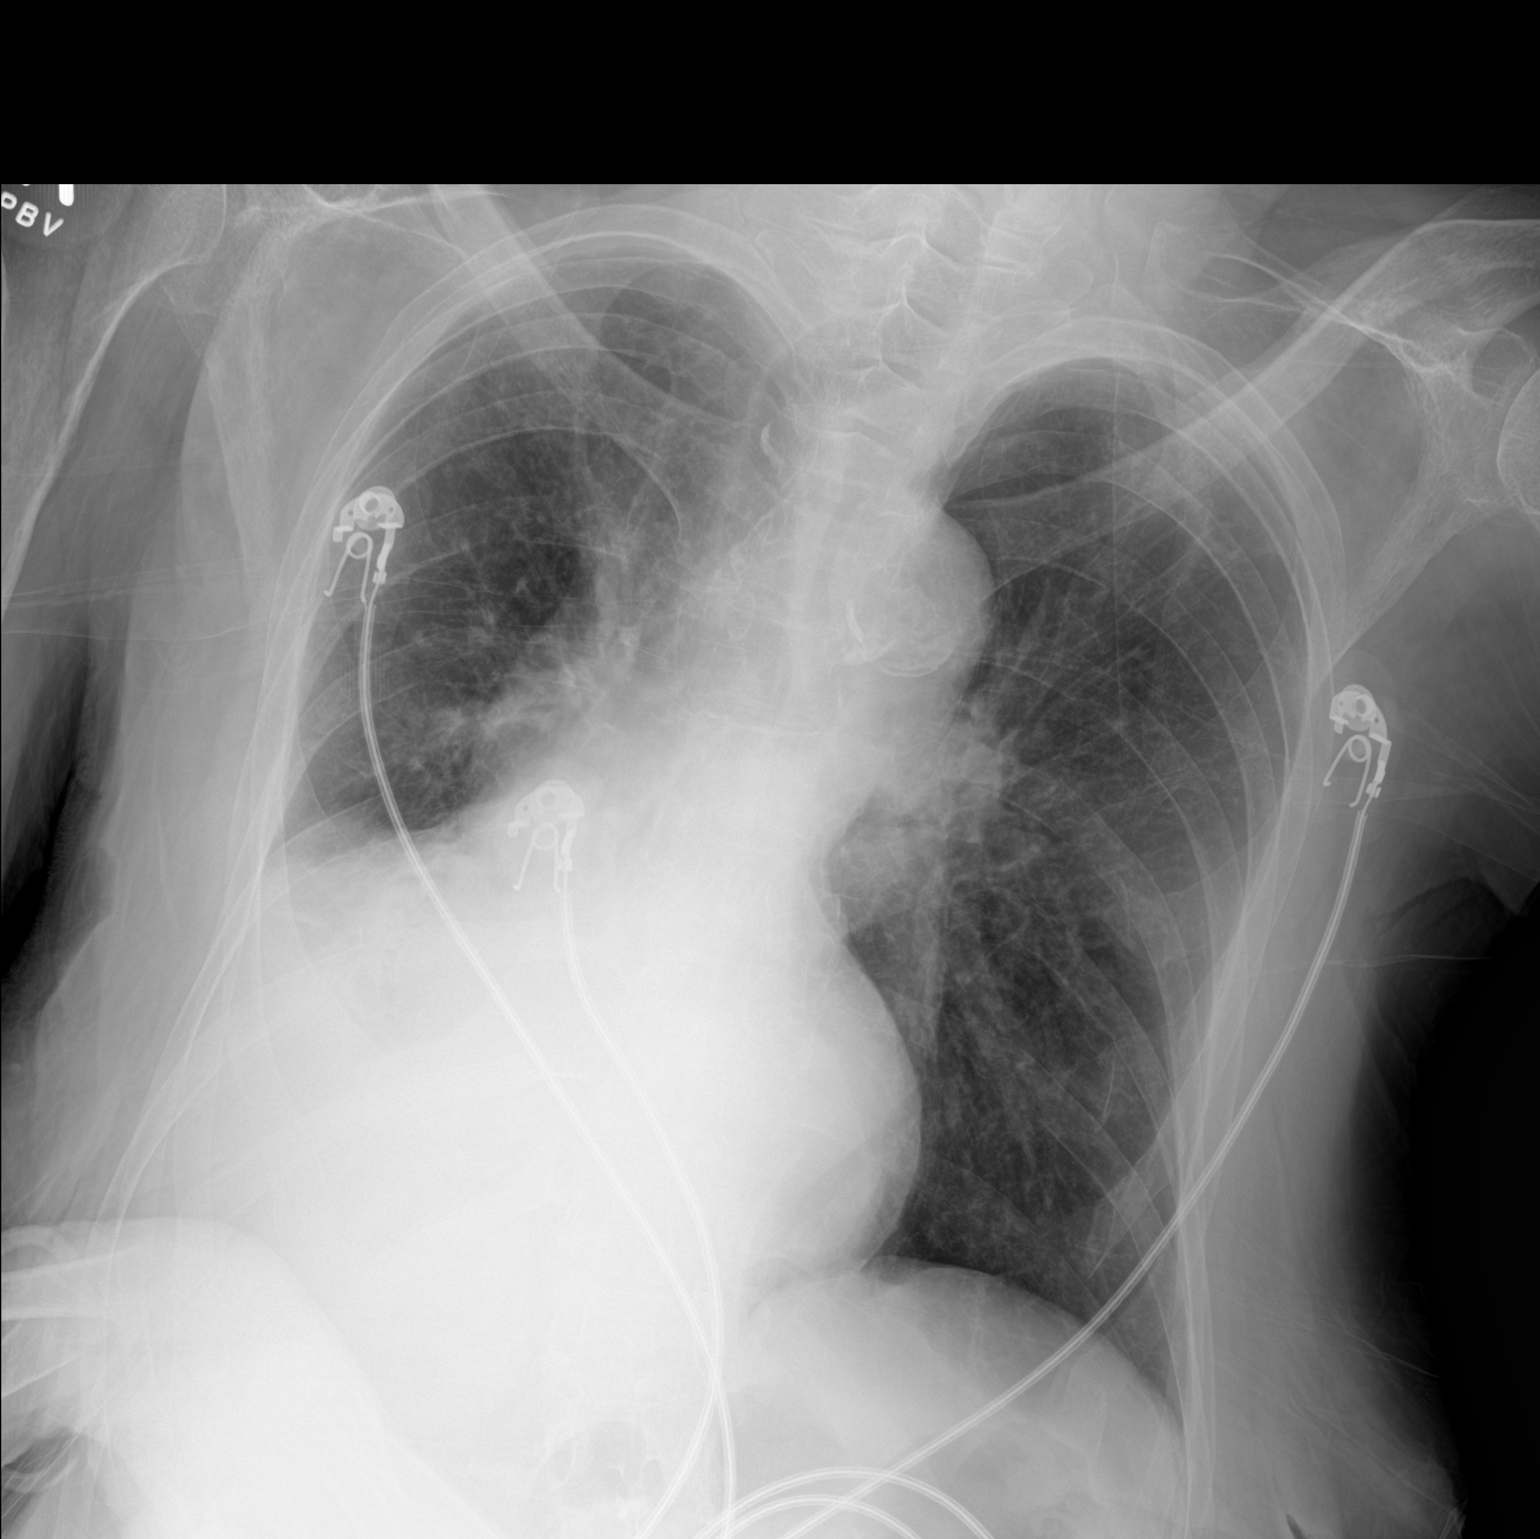

[1 of 1 positions shown; findings below may reference images not displayed]

FINDINGS: Suspected right pleural effusion, small moderate in size.
Consolidation in the right middle lobe and right lower lobe with
slight shift of mediastinal contents to the right suggesting some
volume loss. Left lung is grossly clear. Normal heart size. Aortic
atherosclerosis. Probable emphysematous disease in the apices. No
pneumothorax.
IMPRESSION: Suspected small moderate right pleural effusion with consolidative
process in the right middle lobe and right lower lobe. Rug thick
follow-up to resolution is recommended; alternatively CT could be
considered for further evaluation.
